# Patient Record
Sex: Female | Born: 1935 | Race: White | Hispanic: No | Marital: Single | Smoking: Never smoker
Health system: Southern US, Community
[De-identification: ages and names within clinical notes are randomized; demographics above are authoritative.]

## PROBLEM LIST (undated history)

## (undated) HISTORY — PX: TONSILLECTOMY AND ADENOIDECTOMY: SHX28

## (undated) HISTORY — PX: HEMORRHOID SURGERY: SHX153

---

## 1997-11-16 ENCOUNTER — Encounter: Admission: RE | Admit: 1997-11-16 | Discharge: 1998-02-14 | Payer: Self-pay

## 1998-01-05 ENCOUNTER — Other Ambulatory Visit: Admission: RE | Admit: 1998-01-05 | Discharge: 1998-01-05 | Payer: Self-pay | Admitting: Obstetrics and Gynecology

## 1999-07-18 ENCOUNTER — Other Ambulatory Visit: Admission: RE | Admit: 1999-07-18 | Discharge: 1999-07-18 | Payer: Self-pay | Admitting: Obstetrics and Gynecology

## 2000-02-24 ENCOUNTER — Encounter: Payer: Self-pay | Admitting: Pulmonary Disease

## 2000-02-24 ENCOUNTER — Encounter: Admission: RE | Admit: 2000-02-24 | Discharge: 2000-02-24 | Payer: Self-pay | Admitting: Pulmonary Disease

## 2000-04-18 ENCOUNTER — Observation Stay (HOSPITAL_COMMUNITY): Admission: EM | Admit: 2000-04-18 | Discharge: 2000-04-19 | Payer: Self-pay | Admitting: Emergency Medicine

## 2000-04-18 ENCOUNTER — Encounter: Payer: Self-pay | Admitting: Internal Medicine

## 2000-04-19 ENCOUNTER — Encounter: Payer: Self-pay | Admitting: Internal Medicine

## 2000-04-28 ENCOUNTER — Emergency Department (HOSPITAL_COMMUNITY): Admission: EM | Admit: 2000-04-28 | Discharge: 2000-04-28 | Payer: Self-pay

## 2000-08-17 ENCOUNTER — Emergency Department (HOSPITAL_COMMUNITY): Admission: EM | Admit: 2000-08-17 | Discharge: 2000-08-17 | Payer: Self-pay | Admitting: Emergency Medicine

## 2000-12-13 ENCOUNTER — Encounter: Payer: Self-pay | Admitting: Orthopedic Surgery

## 2000-12-13 ENCOUNTER — Encounter: Admission: RE | Admit: 2000-12-13 | Discharge: 2000-12-13 | Payer: Self-pay | Admitting: Orthopedic Surgery

## 2001-08-13 ENCOUNTER — Other Ambulatory Visit: Admission: RE | Admit: 2001-08-13 | Discharge: 2001-08-13 | Payer: Self-pay | Admitting: Obstetrics and Gynecology

## 2001-11-05 ENCOUNTER — Encounter: Payer: Self-pay | Admitting: Obstetrics and Gynecology

## 2001-11-05 ENCOUNTER — Encounter: Admission: RE | Admit: 2001-11-05 | Discharge: 2001-11-05 | Payer: Self-pay | Admitting: Obstetrics and Gynecology

## 2001-11-14 ENCOUNTER — Encounter: Admission: RE | Admit: 2001-11-14 | Discharge: 2001-12-05 | Payer: Self-pay | Admitting: Neurology

## 2003-01-28 ENCOUNTER — Encounter (INDEPENDENT_AMBULATORY_CARE_PROVIDER_SITE_OTHER): Payer: Self-pay | Admitting: Cardiology

## 2003-01-28 ENCOUNTER — Observation Stay (HOSPITAL_COMMUNITY): Admission: EM | Admit: 2003-01-28 | Discharge: 2003-01-28 | Payer: Self-pay | Admitting: Emergency Medicine

## 2003-08-23 ENCOUNTER — Emergency Department (HOSPITAL_COMMUNITY): Admission: EM | Admit: 2003-08-23 | Discharge: 2003-08-23 | Payer: Self-pay | Admitting: *Deleted

## 2003-11-11 ENCOUNTER — Other Ambulatory Visit: Admission: RE | Admit: 2003-11-11 | Discharge: 2003-11-11 | Payer: Self-pay | Admitting: Obstetrics and Gynecology

## 2004-03-05 ENCOUNTER — Emergency Department (HOSPITAL_COMMUNITY): Admission: EM | Admit: 2004-03-05 | Discharge: 2004-03-05 | Payer: Self-pay | Admitting: Emergency Medicine

## 2004-03-15 ENCOUNTER — Ambulatory Visit: Payer: Self-pay | Admitting: Pulmonary Disease

## 2004-07-26 ENCOUNTER — Emergency Department (HOSPITAL_COMMUNITY): Admission: EM | Admit: 2004-07-26 | Discharge: 2004-07-26 | Payer: Self-pay | Admitting: Emergency Medicine

## 2004-10-22 ENCOUNTER — Emergency Department (HOSPITAL_COMMUNITY): Admission: EM | Admit: 2004-10-22 | Discharge: 2004-10-22 | Payer: Self-pay | Admitting: Emergency Medicine

## 2004-10-23 ENCOUNTER — Emergency Department (HOSPITAL_COMMUNITY): Admission: EM | Admit: 2004-10-23 | Discharge: 2004-10-23 | Payer: Self-pay | Admitting: Emergency Medicine

## 2005-02-18 ENCOUNTER — Emergency Department (HOSPITAL_COMMUNITY): Admission: EM | Admit: 2005-02-18 | Discharge: 2005-02-19 | Payer: Self-pay | Admitting: Emergency Medicine

## 2005-03-20 ENCOUNTER — Emergency Department (HOSPITAL_COMMUNITY): Admission: EM | Admit: 2005-03-20 | Discharge: 2005-03-20 | Payer: Self-pay | Admitting: Emergency Medicine

## 2005-08-25 ENCOUNTER — Encounter: Admission: RE | Admit: 2005-08-25 | Discharge: 2005-08-25 | Payer: Self-pay | Admitting: Orthopedic Surgery

## 2005-09-09 ENCOUNTER — Encounter: Admission: RE | Admit: 2005-09-09 | Discharge: 2005-09-09 | Payer: Self-pay | Admitting: Orthopedic Surgery

## 2005-09-11 ENCOUNTER — Encounter: Admission: RE | Admit: 2005-09-11 | Discharge: 2005-09-11 | Payer: Self-pay | Admitting: Orthopedic Surgery

## 2005-10-11 ENCOUNTER — Observation Stay (HOSPITAL_COMMUNITY): Admission: EM | Admit: 2005-10-11 | Discharge: 2005-10-11 | Payer: Self-pay | Admitting: Internal Medicine

## 2007-12-19 ENCOUNTER — Encounter: Admission: RE | Admit: 2007-12-19 | Discharge: 2007-12-19 | Payer: Self-pay | Admitting: Internal Medicine

## 2008-04-08 ENCOUNTER — Emergency Department (HOSPITAL_COMMUNITY): Admission: EM | Admit: 2008-04-08 | Discharge: 2008-04-08 | Payer: Self-pay | Admitting: Emergency Medicine

## 2010-06-14 LAB — POCT I-STAT, CHEM 8
Chloride: 101 mEq/L (ref 96–112)
Creatinine, Ser: 0.6 mg/dL (ref 0.4–1.2)
Glucose, Bld: 128 mg/dL — ABNORMAL HIGH (ref 70–99)
Hemoglobin: 12.6 g/dL (ref 12.0–15.0)
Potassium: 3.6 mEq/L (ref 3.5–5.1)

## 2010-07-15 NOTE — H&P (Signed)
NAMECHARLIE, CHAR NO.:  [id number]   MEDICAL RECORD NO.:  [id number]          PATIENT TYPE:  EMS   LOCATION:  ED                           FACILITY:  [facility]   PHYSICIAN:  Theressa Millard, M.D.    DATE OF BIRTH:  Dec 22, 1919   DATE OF ADMISSION:  10/10/2005  DATE OF DISCHARGE:                                HISTORY & PHYSICAL   HISTORY OF PRESENT ILLNESS:  Ms. Rhome is an 77 year old white female with  nausea, vomiting, and diarrhea. She accidentally took a 16 mg Razadyne ER  that belongs to her husband. About an hour later, she developed diarrhea,  nausea and vomiting, profuse sweating, and bilateral arm pain. She denies  chest pain and shortness of breath. She came to the emergency department  because she felt so poorly.   PAST MEDICAL HISTORY:  Gastroesophageal reflux disease, osteopenia,  arthritis predominantly in the hands. Wears hearing aids.   PAST SURGICAL HISTORY:  Hemorrhoidectomy, tonsillectomy.   ALLERGIES:  CODEINE, NON-STEROIDAL ANTI-INFLAMMATORY DRUGS.   MEDICATIONS:  Profenamine 2 mg daily, aspirin 81 mg every other day.   SOCIAL HISTORY:  Does not smoke. Does not consume alcohol. Lives at home  with her demented husband and she is the primary caregiver.   FAMILY HISTORY:  Noncontributory.   REVIEW OF SYSTEMS:  Otherwise negative.   PHYSICAL EXAMINATION:  VITAL SIGNS:  Blood pressure 180/70, pulse 80,  respiratory rate 20, O2 saturation 98%.  GENERAL:  She is acutely ill appearing.  HEENT:  Eyes have pupils, which are round and react to light. Extraocular  movements are intact. Ears, nose, and throat are unremarkable.  NECK:  Supple. Thyroid is not enlarged or tender.  CHEST:  Clear to auscultation and percussion.  CARDIAC:  Normal S1 and S2 without S3, S4, murmur, rub, or click.  ABDOMEN:  Soft, nontender.   LABORATORY DATA:  BUN 13, creatinine 0.7, sodium 133, potassium 3.4.   Other laboratory data pending.   IMPRESSION:  1.  Nausea, vomiting, and diarrhea with perfuse sweating. This could be a      possible side effect of Razadyne versus a gastroenteritis versus other.      Admit for IV fluids and observation for worsening status.  2. Anxiety.  3. Gastroesophageal reflux disease.  4. Arthritis.      Theressa Millard, M.D.  Electronically Signed     JO/MEDQ  D:  10/10/2005  T:  10/11/2005  Job:  [id number]

## 2010-07-15 NOTE — Discharge Summary (Signed)
[place]  Patient:    Michelle Mayo, Michelle Mayo                        MRN: [id number] Adm. Date:  04/18/00 Disc. Date: 04/19/00 Attending:  Nathen May, M.D., [facility] Dictator:   Gene Serpe, P.A. CC:         Lonzo Cloud. Kriste Basque, M.D. [facility]   Referring Physician Discharge [place]  PROCEDURES:  Persantine Cardiolite on April 19, 2000.  REASON FOR ADMISSION:  Please refer to the dictated admission note.  LABORATORY DATA:  Admission CBC:  Marginal elevated WBC at 10.7, follow-up WBC 10.1 - otherwise normal CBC.  Sodium 132, potassium 3.8, glucose 122, BUN 17, creatinine 0.6 on admission - follow-up BMP normal.  CPK-MB negative x 3; troponin I 0.02, 0.06, and 0.01.  Admission CXR:  Mild bronchitic changes; no acute abnormalities.  HOSPITAL COURSE:  The patient is a 77 year old female with a long history of atypical chest discomfort and cardiac risk factor notable for a family history of CAD, who was recently referred to Nathen May, M.D., F.A.C.C., for cardiac evaluation and admitted for rule out of MI and further diagnostic evaluation.  Serial cardiac enzymes were negative.  Lovenox was initiated on admission and subsequently discontinued following the rule out of MI.  The plan was to proceed with pharmacologic stress testing.  Persantine Cardiolite performed on April 19, 2000, revealed normal perfusion images with calculated EF 61%.  No medication adjustments recommended - patient discharged home on previous medication regimen.  MEDICATIONS AT DISCHARGE: 1. Nexium 40 mg q.d. 2. Fosamax 70 mg weekly. 3. Penphanazine 2 mg q.d. 4. Celebrex 2 mg q.d. 5. Axid 300 mg q.h.s.  DISCHARGE INSTRUCTIONS:  The patient is instructed to schedule a follow-up appointment with Lonzo Cloud. Kriste Basque, M.D., in the following one to two weeks.  She will return to Nathen May, M.D., F.A.C.C., for cardiac management on an as needed  basis.  DISCHARGE DIAGNOSES: 1. Non-ischemic chest discomfort.    a. Negative serial cardiac enzymes.    b. Normal Persantine Cardiolite on April 19, 2000. 2. Gastroesophageal reflux disease. DD:  04/19/00 TD:  04/19/00 Job: [id number]

## 2010-07-15 NOTE — Discharge Summary (Signed)
Michelle Mayo, Michelle Mayo NO.:  [id number]   MEDICAL RECORD NO.:  [id number]          PATIENT TYPE:  OBV   LOCATION:  5727                         FACILITY:  [facility]   PHYSICIAN:  Thora Lance, M.D.  DATE OF BIRTH:  30-Nov-1919   DATE OF ADMISSION:  10/10/2005  DATE OF DISCHARGE:  10/11/2005                                 DISCHARGE SUMMARY   REASON FOR ADMISSION:  The patient is an 77 year old white female who had  accidentally taken Razadyne ER 16 mg pill the day of admission.  She had  developed diarrhea, nausea and vomiting and perfuse sweating and arm pain.  She denied chest pain or dyspnea.   SIGNIFICANT FINDINGS:  VITAL SIGNS:  Blood pressure 180/70, heart rate 80,  respirations 20.  ABDOMEN:  Soft and nontender.  LUNGS:  Clear.  HEART:  Regular rate and rhythm without murmurs, gallops or rubs.   LABORATORY DATA:  On admission, BMET with sodium 133, potassium 3.4,  chloride 102, bicarbonate 25, BUN 13, creatinine 0.7.   HOSPITAL COURSE:  The patient was admitted for nausea, vomiting and  diarrhea.  This was felt likely a side effect of Razadyne versus  gastroenteritis.  The patient was treated with IV fluids and potassium.  By  the next hospital day, the patient felt markedly better and back to her  baseline state.  She was discharged home.  She was given a dose of potassium  prior to discharge for a K of 3.4 on her lab work.  Her glucose was 208.  This will need to be followed up as an outpatient.   DISCHARGE DIAGNOSES:  1. Side effect of Razadyne.  2. Nausea and vomiting.  3. Diarrhea.  4. Gastroesophageal reflux disease.  5. Osteopenia.  6. Arthritis.  7. Hearing loss.   PROCEDURES:  None.   DISCHARGE MEDICATIONS:  Resume prior medications.   FOLLOWUP:  Follow up next visit with Dr. Valentina Lucks.   ACTIVITY:  As tolerated.   DISPOSITION:  Discharged to home.           ______________________________  Thora Lance, M.D.     JJG/MEDQ  D:   11/01/2005  T:  11/01/2005  Job:  [id number]

## 2010-09-13 IMAGING — CT CT CHEST W/ CM
2 of 4 series · 15 of 36 positions shown, 18 images · IV contrast (75CC OMNI 300)
Comparison: [HOSPITAL] chest x-ray report 04/28/2000 and
[HOSPITAL] chest x-ray 07/26/2004, and [REDACTED] [facility] chest x-ray 12/12/2007.

CLINICAL DATA: Cough.  Abnormal chest x-ray.

CT CHEST WITH CONTRAST
TECHNIQUE: Multidetector CT imaging of the chest was performed
following the standard protocol during bolus administration of
intravenous contrast.
Contrast: 75 ml [id number]

[Series 3: routine chest · axial · 0.61mm/px · z∈[-279,+21]mm · 12 of 70 slices shown, 15 images]
[im 5/70  mediastinal]
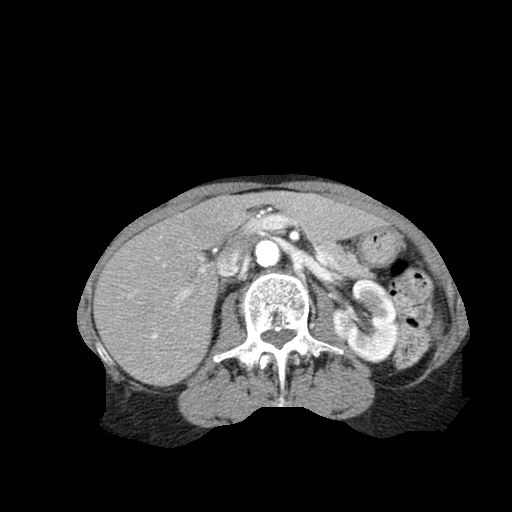
[im 5/70  lung]
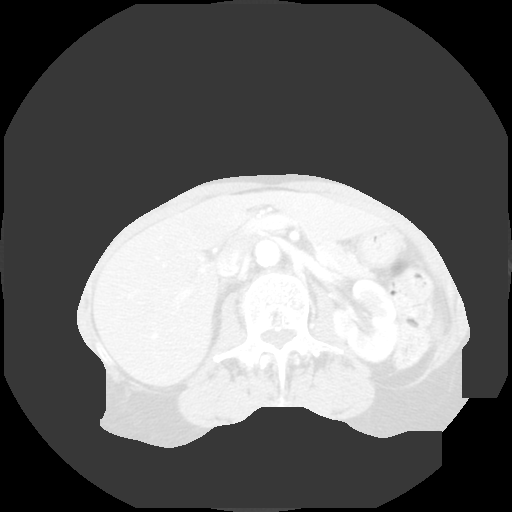
[im 10/70  lung]
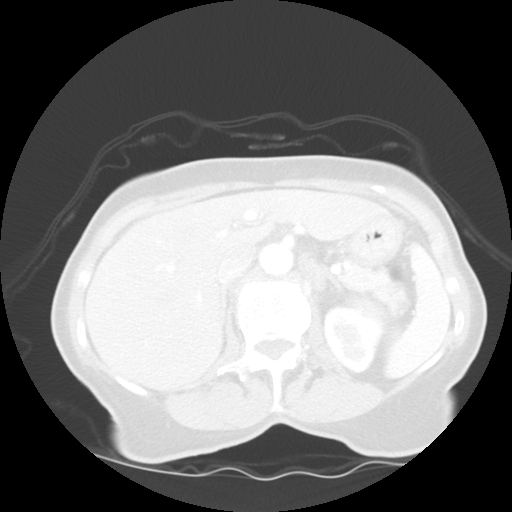
[im 15/70  lung]
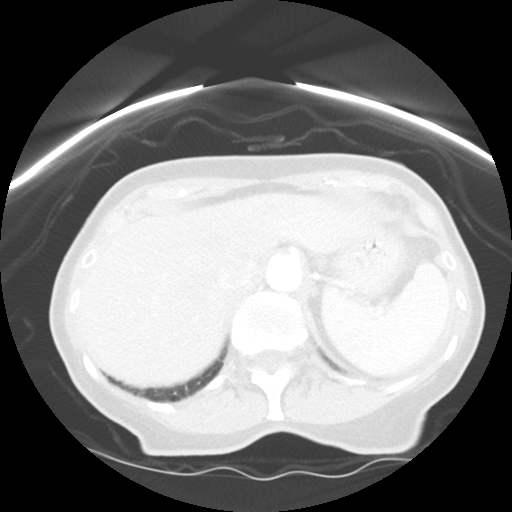
[im 20/70  lung]
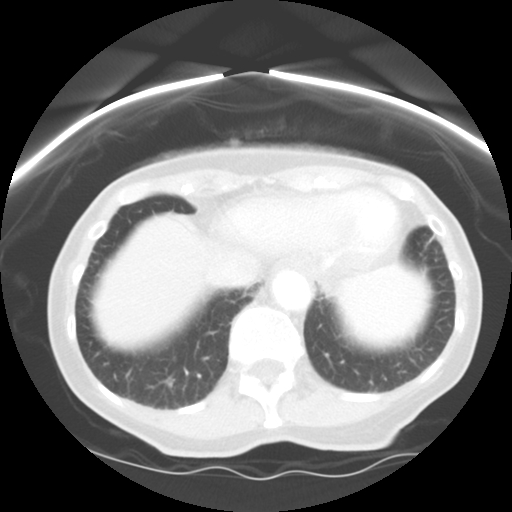
[im 25/70  mediastinal]
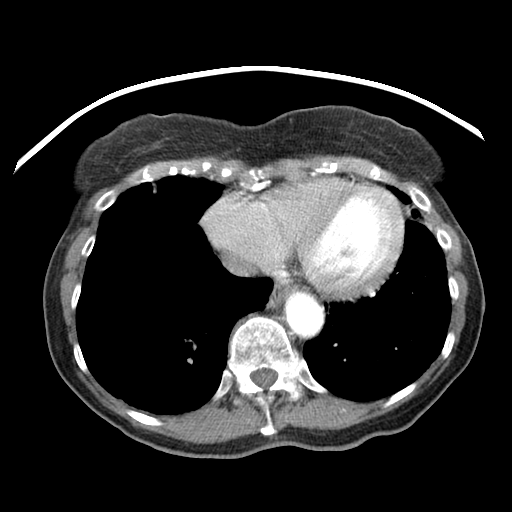
[im 25/70  lung]
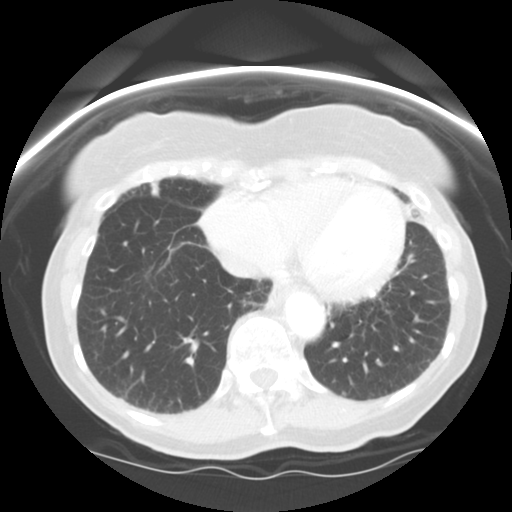
[im 30/70  lung]
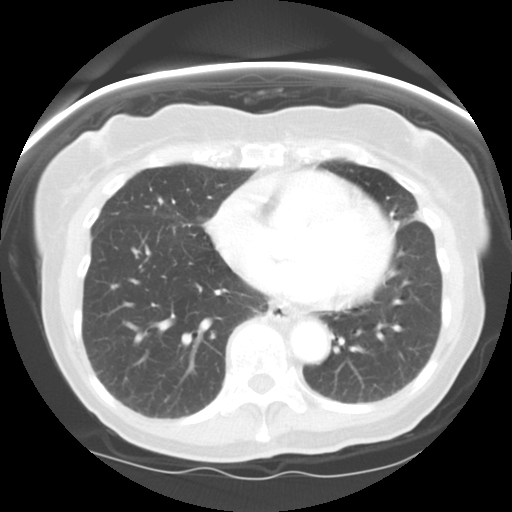
[im 40/70  lung]
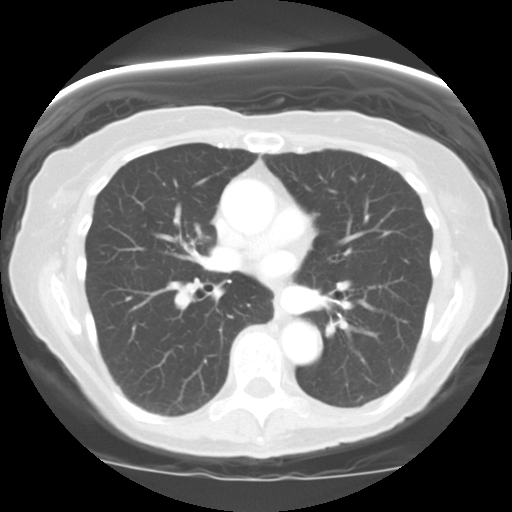
[im 45/70  lung]
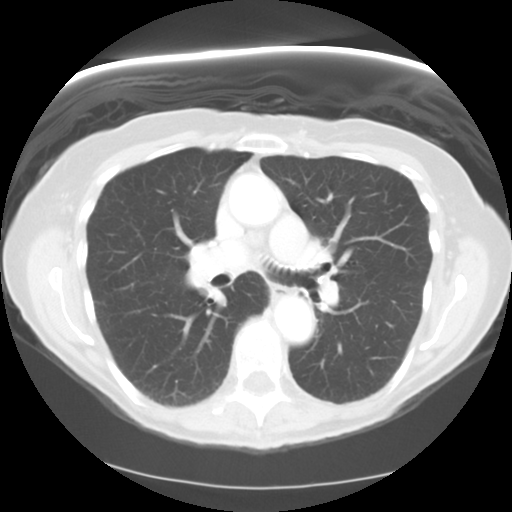
[im 50/70  mediastinal]
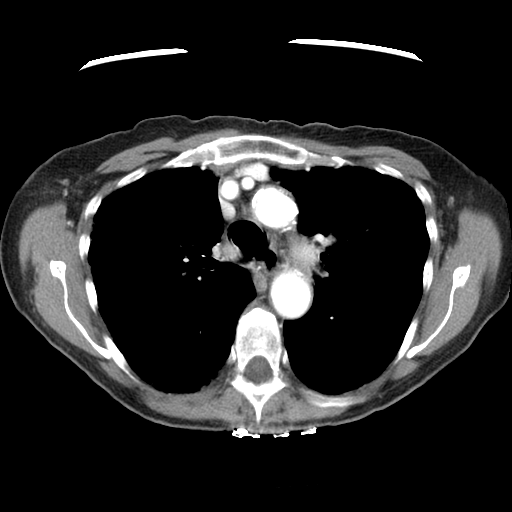
[im 50/70  lung]
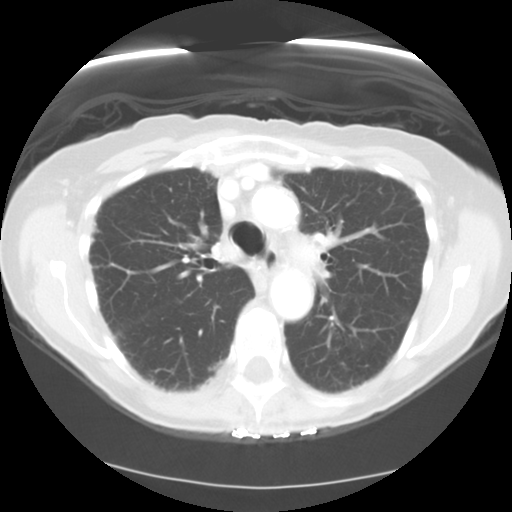
[im 55/70  lung]
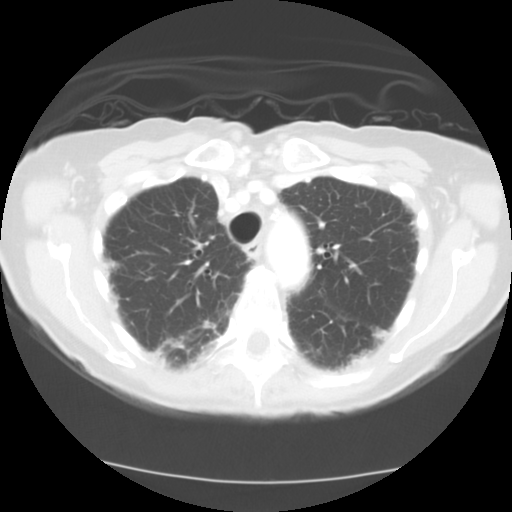
[im 60/70  lung]
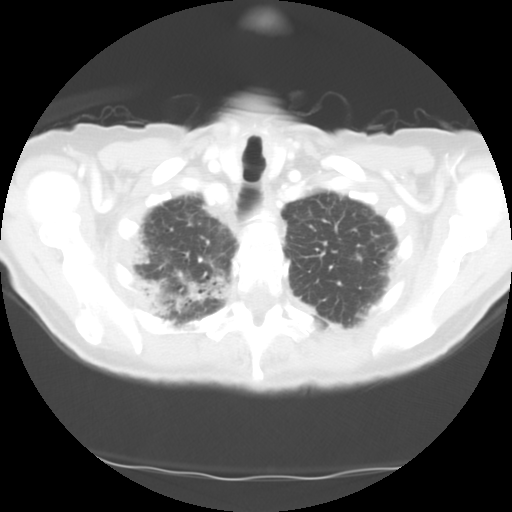
[im 65/70  lung]
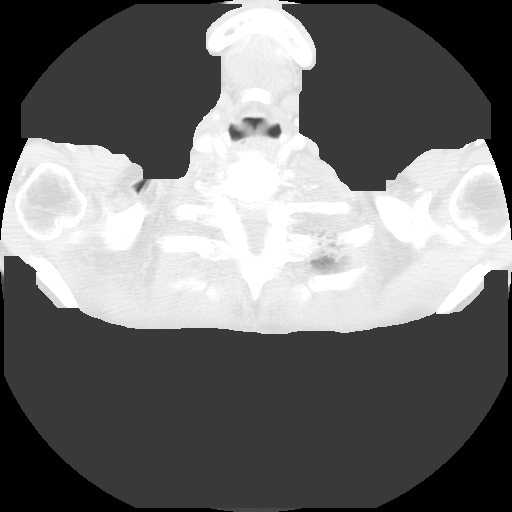

[Series 602: sagittal body · sagittal · 0.68mm/px · 3 of 126 slices shown]
[im 26/126  lung]
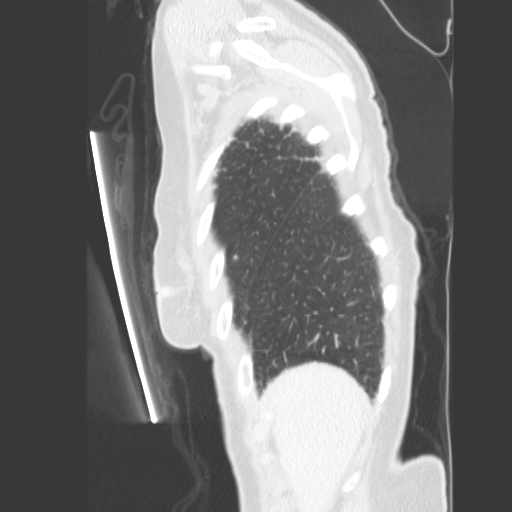
[im 51/126  lung]
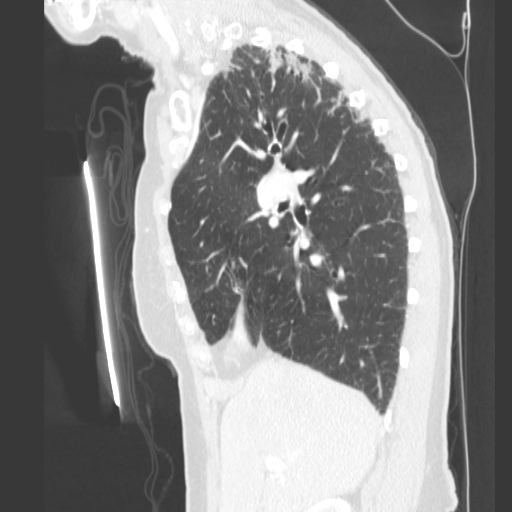
[im 76/126  lung]
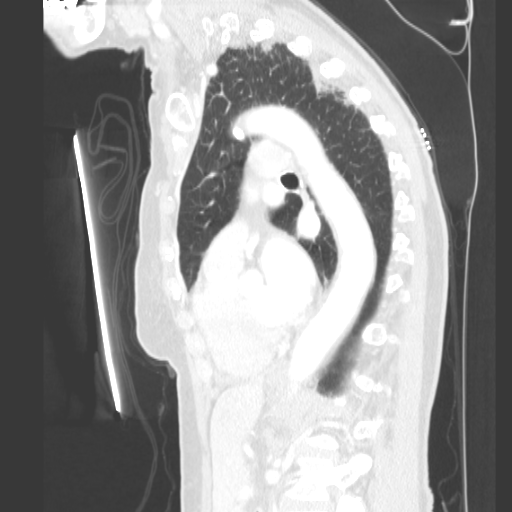

[15 of 36 positions shown; findings below may reference images not displayed]

FINDINGS: Unchanged (right greater than left) advanced chronic
pleural parenchymal scarring at the lung apices.  Hyperinflation
and generalized prominence bronchopulmonary markings stable.
Slight coalescing interstitial opacities are seen at the medial
left upper lobe (axial image 27) and at the inferior medial right
middle lobe (axial image 39). Lungs are otherwise clear. No
significant bronchiectasis seen at the right lower lobe visualized.
Heart size is normal.  No mediastinal, hilar or
axillary/supraclavicular mass/adenopathy seen.  Slight atheromatous
vascular calcification with normal caliber thoracic and upper
abdominal aorta seen.  B1inimal linear benign,  calcification right
adrenal gland visualized.  Left adrenal gland demonstrates slight
diffuse enlargement consistent with unilateral slight hypertrophy
and no focal nodules.
IMPRESSION: 1.  Stable (right greater than left) advanced chronic pleural
parenchymal biapical lung changes with hyperinflation and
generalized prominence bronchopulmonary markings of COPD/chronic
bronchitis.
2.  No CT evidence for significant right lower lobe bronchiectasis
with more focal minimal interstitial pneumonitis or chronic
scarring at the medial left upper lobe and inferomedial right
middle lobe. No other new active cardiopulmonary disease.
3.  Benign appearing slight left adrenal hyperplasia with benign
appearing linear calcification right adrenal gland.
4.  Atheromatous vascular calcification.

## 2011-10-10 ENCOUNTER — Ambulatory Visit: Payer: Self-pay | Admitting: Physical Therapy

## 2011-10-11 ENCOUNTER — Ambulatory Visit: Payer: Self-pay | Admitting: Physical Therapy

## 2011-10-18 ENCOUNTER — Ambulatory Visit: Payer: Self-pay | Admitting: Physical Therapy

## 2011-11-21 ENCOUNTER — Ambulatory Visit: Payer: Medicare Other | Attending: Internal Medicine

## 2011-11-21 DIAGNOSIS — IMO0001 Reserved for inherently not codable concepts without codable children: Secondary | ICD-10-CM | POA: Insufficient documentation

## 2011-11-21 DIAGNOSIS — M25569 Pain in unspecified knee: Secondary | ICD-10-CM | POA: Insufficient documentation

## 2011-11-21 DIAGNOSIS — M545 Low back pain, unspecified: Secondary | ICD-10-CM | POA: Insufficient documentation

## 2011-11-21 DIAGNOSIS — R5381 Other malaise: Secondary | ICD-10-CM | POA: Insufficient documentation

## 2011-11-22 ENCOUNTER — Ambulatory Visit: Payer: Self-pay

## 2011-11-23 ENCOUNTER — Ambulatory Visit: Payer: Medicare Other | Admitting: Physical Therapy

## 2011-11-28 ENCOUNTER — Ambulatory Visit: Payer: Medicare Other | Admitting: Physical Therapy

## 2011-11-30 ENCOUNTER — Ambulatory Visit: Payer: Medicare Other | Attending: Internal Medicine

## 2011-11-30 DIAGNOSIS — M25569 Pain in unspecified knee: Secondary | ICD-10-CM | POA: Insufficient documentation

## 2011-11-30 DIAGNOSIS — M545 Low back pain, unspecified: Secondary | ICD-10-CM | POA: Insufficient documentation

## 2011-11-30 DIAGNOSIS — R5381 Other malaise: Secondary | ICD-10-CM | POA: Insufficient documentation

## 2011-11-30 DIAGNOSIS — IMO0001 Reserved for inherently not codable concepts without codable children: Secondary | ICD-10-CM | POA: Insufficient documentation

## 2011-12-05 ENCOUNTER — Ambulatory Visit: Payer: Medicare Other | Admitting: Physical Therapy

## 2011-12-07 ENCOUNTER — Ambulatory Visit: Payer: Medicare Other

## 2011-12-12 ENCOUNTER — Ambulatory Visit: Payer: Medicare Other

## 2011-12-14 ENCOUNTER — Ambulatory Visit: Payer: Medicare Other

## 2011-12-19 ENCOUNTER — Ambulatory Visit: Payer: Medicare Other | Admitting: Physical Therapy

## 2011-12-21 ENCOUNTER — Ambulatory Visit: Payer: Medicare Other

## 2013-01-07 ENCOUNTER — Ambulatory Visit (INDEPENDENT_AMBULATORY_CARE_PROVIDER_SITE_OTHER): Payer: Medicare Other

## 2013-01-07 VITALS — BP 124/64 | HR 70 | Resp 20 | Ht 64.5 in | Wt 119.0 lb

## 2013-01-07 DIAGNOSIS — M2040 Other hammer toe(s) (acquired), unspecified foot: Secondary | ICD-10-CM

## 2013-01-07 DIAGNOSIS — M79609 Pain in unspecified limb: Secondary | ICD-10-CM

## 2013-01-07 DIAGNOSIS — Q828 Other specified congenital malformations of skin: Secondary | ICD-10-CM

## 2013-01-07 DIAGNOSIS — M2012 Hallux valgus (acquired), left foot: Secondary | ICD-10-CM

## 2013-01-07 DIAGNOSIS — M2042 Other hammer toe(s) (acquired), left foot: Secondary | ICD-10-CM

## 2013-01-07 DIAGNOSIS — M2010 Hallux valgus (acquired), unspecified foot: Secondary | ICD-10-CM

## 2013-01-07 NOTE — Patient Instructions (Signed)
Corns and Calluses Corns are small areas of thickened skin that usually occur on the top, sides, or tip of a toe. They contain a cone-shaped core with a point that can press on a nerve below. This causes pain. Calluses are areas of thickened skin that usually develop on hands, fingers, palms, soles of the feet, and heels. These are areas that experience frequent friction or pressure. CAUSES  Corns are usually the result of rubbing (friction) or pressure from shoes that are too tight or do not fit properly. Calluses are caused by repeated friction and pressure on the affected areas. SYMPTOMS  A hard growth on the skin.  Pain or tenderness under the skin.  Sometimes, redness and swelling.  Increased discomfort while wearing tight-fitting shoes. DIAGNOSIS  Your caregiver can usually tell what the problem is by doing a physical exam. TREATMENT  Removing the cause of the friction or pressure is usually the only treatment needed. However, sometimes medicines can be used to help soften the hardened, thickened areas. These medicines include salicylic acid plasters and 12% ammonium lactate lotion. These medicines should only be used under the direction of your caregiver. HOME CARE INSTRUCTIONS   Try to remove pressure from the affected area.  You may wear donut-shaped corn pads to protect your skin.  You may use a pumice stone or nonmetallic nail file to gently reduce the thickness of a corn.  Wear properly fitted footwear.  If you have calluses on the hands, wear gloves during activities that cause friction.  If you have diabetes, you should regularly examine your feet. Tell your caregiver if you notice any problems with your feet. SEEK IMMEDIATE MEDICAL CARE IF:   You have increased pain, swelling, redness, or warmth in the affected area.  Your corn or callus starts to drain fluid or bleeds.  You are not getting better, even with treatment. Document Released: 11/20/2003 Document  Revised: 05/08/2011 Document Reviewed: 10/11/2010 [facility] Patient Information 2014 [place], [place]. Bunion You have a bunion deformity of the feet. This is more common in women. It tends to be an inherited problem. Symptoms can include pain, swelling, and deformity around the great toe. Numbness and tingling may also be present. Your symptoms are often worsened by wearing shoes that cause pressure on the bunion. Changing the type of shoes you wear helps reduce symptoms. A wide shoe decreases pressure on the bunion. An arch support may be used if you have flat feet. Avoid shoes with heels higher than two inches. This puts more pressure on the bunion. X-rays may be helpful in evaluating the severity of the problem. Other foot problems often seen with bunions include corns, calluses, and hammer toes. If the deformity or pain is severe, surgical treatment may be necessary. Keep off your painful foot as much as possible until the pain is relieved. Call your caregiver if your symptoms are worse.  SEEK IMMEDIATE MEDICAL CARE IF:  You have increased redness, pain, swelling, or other symptoms of infection. Document Released: 02/13/2005 Document Revised: 05/08/2011 Document Reviewed: 08/13/2006 [facility] Patient Information 2014 [place], [place].

## 2013-01-07 NOTE — Progress Notes (Signed)
  Subjective:    Patient ID: Michelle Mayo, female    DOB: 12-27-1919, 79 y.o.   MRN: [id number] "I have a corn in between my toes and it hurts."  HPI no changes medication her health history since patient was last seen within 2 months ago to    Review of Systems  Constitutional: Negative.   Respiratory: Negative.   Cardiovascular: Negative.   Gastrointestinal: Negative.   Endocrine: Negative.   Musculoskeletal: Negative.   Skin: Negative.   Neurological: Negative.   Psychiatric/Behavioral: Negative.   All other systems reviewed and are negative.       Objective:   Physical Exam Neurovascular status is intact with pedal pulses palpable DP postal for PT pulse one over 4 skin temperature warm turgor normal no edema rubor pallor or varicosities noted. Epicritic and proprioceptive sensations are intact and symmetric bilateral. There is normal plantar response and DTRs noted. Dermatologically skin color pigment normal nails unremarkable somewhat complex is noted. There is a hemorrhage a keratoses medial surface second digit left foot. 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 There is keratoses with no open wound or ulceration patient is been wearing separating pads or toe separators with little or no success has recurrent keratoses. Painful on direct compression with enclosed shoe wear.       Assessment & Plan:  Assessment keratoses report keratoses secondary digital contractures HAV deformity and hammertoe deformity second left. Plan at this time in keratotic lesion is debrided Neosporin and Band-Aid are applied there is no bleeding discharge or drainage no signs of secondary infection. Patient is intact neurovascular status at this time tube foam padding was dispensed. Patient is advised the only permanent solution would be possible surgical intervention for bunion or hammertoe repair or possible  amputation of toe. Followup in the future and as-needed basis there's any recurrence or recess rotation. Patient reminded likely non-covered service as he is not diabetic or is no neurovascular compromise. Patient is getting nail care at her nursing facility.  Alvan Dame DPM

## 2013-04-17 ENCOUNTER — Ambulatory Visit: Payer: PRIVATE HEALTH INSURANCE | Admitting: Podiatrist

## 2013-05-08 ENCOUNTER — Ambulatory Visit (INDEPENDENT_AMBULATORY_CARE_PROVIDER_SITE_OTHER): Payer: Medicare Other | Admitting: Podiatrist

## 2013-05-08 ENCOUNTER — Encounter: Payer: Self-pay | Admitting: Podiatrist

## 2013-05-08 VITALS — BP 112/86 | HR 64 | Resp 12

## 2013-05-08 DIAGNOSIS — B351 Tinea unguium: Secondary | ICD-10-CM

## 2013-05-08 DIAGNOSIS — M79609 Pain in unspecified limb: Secondary | ICD-10-CM

## 2013-05-08 NOTE — Progress Notes (Signed)
   HPI:  Patient presents today for foot and nail care. She complains of long toenails that are painful and symptomatic.  She last saw Dr. Ralene CorkSikora for her keratotic lesion on the left 2nd toe.  Objective:  Patients chart is reviewed.  Vascular status reveals pedal pulses noted at 2 out of 4 dp and pt bilateral .  Neurological sensation is Normal to [facility]il[facility] bilateral.  Patients nails are thickened, discolored, distrophic, friable and brittle with yellow-brown discoloration. Patient subjectively relates they are painful with shoes and with ambulation of bilateral feet. Keratotic lesion present left 2nd toe. Bilateral bunion deformity is present  Assessment:  Symptomatic onychomycosis, keratotic lesion 2nd toe  Plan:  Discussed treatment options and alternatives.  The symptomatic toenails were debrided through manual an mechanical means without complication.  Return appointment recommended at routine intervals of 3 months    Michelle Mayo, DPM

## 2013-08-07 ENCOUNTER — Ambulatory Visit: Payer: PRIVATE HEALTH INSURANCE | Admitting: Podiatrist

## 2023-03-31 DEATH — deceased
